# Patient Record
Sex: Male | Born: 1990 | Race: White | Hispanic: No | Marital: Single | Smoking: Former smoker
Health system: Southern US, Community
[De-identification: ages and names within clinical notes are randomized; demographics above are authoritative.]

## PROBLEM LIST (undated history)

## (undated) DIAGNOSIS — F909 Attention-deficit hyperactivity disorder, unspecified type: Secondary | ICD-10-CM

## (undated) HISTORY — PX: FEMUR SURGERY: SHX943

---
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
## 2013-05-02 ENCOUNTER — Emergency Department: Payer: Self-pay | Admitting: Emergency Medicine

## 2013-05-02 LAB — CBC WITH DIFFERENTIAL/PLATELET
BASOS ABS: 0 10*3/uL (ref 0.0–0.1)
Basophil %: 0.2 %
EOS PCT: 0.7 %
Eosinophil #: 0.1 10*3/uL (ref 0.0–0.7)
HCT: 42.5 % (ref 40.0–52.0)
HGB: 14.9 g/dL (ref 13.0–18.0)
LYMPHS ABS: 2.3 10*3/uL (ref 1.0–3.6)
Lymphocyte %: 15.9 %
MCH: 29.7 pg (ref 26.0–34.0)
MCHC: 35.1 g/dL (ref 32.0–36.0)
MCV: 85 fL (ref 80–100)
MONO ABS: 1.5 x10 3/mm — AB (ref 0.2–1.0)
Monocyte %: 10.3 %
NEUTROS ABS: 10.7 10*3/uL — AB (ref 1.4–6.5)
Neutrophil %: 72.9 %
PLATELETS: 225 10*3/uL (ref 150–440)
RBC: 5.04 10*6/uL (ref 4.40–5.90)
RDW: 12.7 % (ref 11.5–14.5)
WBC: 14.6 10*3/uL — AB (ref 3.8–10.6)

## 2013-05-02 LAB — RAPID INFLUENZA A&B ANTIGENS (ARMC ONLY)

## 2013-05-02 LAB — COMPREHENSIVE METABOLIC PANEL
ALBUMIN: 4.4 g/dL (ref 3.4–5.0)
ALT: 15 U/L (ref 12–78)
AST: 12 U/L — AB (ref 15–37)
Alkaline Phosphatase: 63 U/L
Anion Gap: 7 (ref 7–16)
BUN: 12 mg/dL (ref 7–18)
Bilirubin,Total: 1 mg/dL (ref 0.2–1.0)
CREATININE: 0.71 mg/dL (ref 0.60–1.30)
Calcium, Total: 9.6 mg/dL (ref 8.5–10.1)
Chloride: 101 mmol/L (ref 98–107)
Co2: 25 mmol/L (ref 21–32)
EGFR (African American): >60
EGFR (Non-African Amer.): >60
Glucose: 80 mg/dL (ref 65–99)
OSMOLALITY: 265 (ref 275–301)
Potassium: 3.5 mmol/L (ref 3.5–5.1)
Sodium: 133 mmol/L — ABNORMAL LOW (ref 136–145)
Total Protein: 8 g/dL (ref 6.4–8.2)

## 2013-05-02 LAB — URINALYSIS, COMPLETE
BLOOD: NEGATIVE
Bacteria: NONE SEEN
Bilirubin,UR: NEGATIVE
Glucose,UR: NEGATIVE mg/dL (ref 0–75)
LEUKOCYTE ESTERASE: NEGATIVE
Nitrite: NEGATIVE
PROTEIN: NEGATIVE
Ph: 5 (ref 4.5–8.0)
SQUAMOUS EPITHELIAL: NONE SEEN
Specific Gravity: 1.027 (ref 1.003–1.030)

## 2013-05-02 LAB — LIPASE, BLOOD: Lipase: 105 U/L (ref 73–393)

## 2013-05-03 LAB — MONONUCLEOSIS SCREEN: MONO TEST: NEGATIVE

## 2014-02-04 ENCOUNTER — Ambulatory Visit: Payer: Self-pay | Admitting: Family Medicine

## 2014-08-16 ENCOUNTER — Encounter: Payer: Self-pay | Admitting: Family Medicine

## 2014-08-16 DIAGNOSIS — Z8379 Family history of other diseases of the digestive system: Secondary | ICD-10-CM | POA: Insufficient documentation

## 2015-05-25 ENCOUNTER — Ambulatory Visit
Admission: EM | Admit: 2015-05-25 | Discharge: 2015-05-25 | Disposition: A | Discharge status: Home / Self Care | Payer: BLUE CROSS/BLUE SHIELD | Attending: Family Medicine | Admitting: Family Medicine

## 2015-05-25 ENCOUNTER — Encounter: Payer: Self-pay | Admitting: Emergency Medicine

## 2015-05-25 DIAGNOSIS — H6501 Acute serous otitis media, right ear: Secondary | ICD-10-CM

## 2015-05-25 DIAGNOSIS — J018 Other acute sinusitis: Secondary | ICD-10-CM | POA: Diagnosis not present

## 2015-05-25 LAB — RAPID STREP SCREEN (MED CTR MEBANE ONLY): Streptococcus, Group A Screen (Direct): NEGATIVE

## 2015-05-25 MED ORDER — AMOXICILLIN-POT CLAVULANATE 875-125 MG PO TABS: 1.0000 | ORAL_TABLET | Freq: Two times a day (BID) | ORAL | Status: DC

## 2015-05-25 NOTE — ED Provider Notes (Signed)
CSN: [id number]     Arrival date & time 05/25/15  1021 History   First MD Initiated Contact with Patient 05/25/15 1127     Chief Complaint  Patient presents with  . URI   HPI   Danny Miles is a pleasant 25 y.o. male who presents with one-month history of sore throat, sinus pressure, thus rhinorrhea, fever. He states he cannot get rid of head congestion and stuffiness. His mother has had a recent URI and otitis. His MAXIMUM TEMPERATURE has been 101. He has a productive cough with green phlegm. 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 She has tried Mucinex D, Flonase, and Aleve with some relief. Symptoms are worse when he is up and working better with rest. Discomfort 5/10 currently.  He is accompanied by his mother.   History reviewed. No pertinent past medical history. Past Surgical History  Procedure Laterality Date  . Femur surgery     History reviewed. No pertinent family history. Social History  Substance Use Topics  . Smoking status: Current Every Day Smoker    Types: Cigarettes  . Smokeless tobacco: Never Used  . Alcohol Use: 0.6 oz/week    1 Shots of liquor per week    Review of Systems  Constitutional: Positive for fever, chills, activity change, appetite change and fatigue. Negative for diaphoresis and unexpected weight change.  HENT: Positive for congestion, ear pain, postnasal drip, rhinorrhea, sinus pressure, sneezing and sore throat. Negative for trouble swallowing.   Eyes: Positive for redness. Negative for photophobia, pain, discharge and itching.  Respiratory: Positive for cough. Negative for chest tightness, shortness of breath, wheezing and stridor.   Cardiovascular: Negative.   Gastrointestinal: Negative.   Genitourinary: Negative.   Musculoskeletal: Negative.   Skin: Negative.   Neurological: Negative.   Psychiatric/Behavioral: Negative.     Allergies  Review of patient's allergies indicates no known allergies.  Home Medications    Prior to Admission medications   Not on File   Meds Ordered and Administered this Visit  Medications - No data to display  BP 111/60 mmHg  Pulse 87  Temp(Src) 97.7 F (36.5 C) (Tympanic)  Resp 18  Ht  (1.778 m)  Wt 176 lb (79.833 kg)  BMI 25.25 kg/m2  SpO2 98% No data found.   Physical Exam  Constitutional: He is oriented to person, place, and time. He appears well-developed and well-nourished. No distress.  HENT:  Head: Normocephalic and atraumatic.  Right Ear: There is tenderness. Tympanic membrane is injected and erythematous. A middle ear effusion is present.  Left Ear: Tympanic membrane, external ear and ear canal normal.  Nose: Mucosal edema, rhinorrhea and sinus tenderness present. Right sinus exhibits maxillary sinus tenderness and frontal sinus tenderness. Left sinus exhibits maxillary sinus tenderness and frontal sinus tenderness.  Mouth/Throat: Uvula is midline and mucous membranes are normal. Oropharyngeal exudate and posterior oropharyngeal erythema present. No posterior oropharyngeal edema or tonsillar abscesses.  Eyes: Conjunctivae are normal. No scleral icterus.  Neck: Normal range of motion.  Cardiovascular: Normal rate and regular rhythm.   Pulmonary/Chest: Effort normal and breath sounds normal. No respiratory distress.  Abdominal: Soft. Bowel sounds are normal. He exhibits no distension.  Musculoskeletal: Normal range of motion. He exhibits no edema or tenderness.  Neurological: He is alert and oriented to person, place, and time. No cranial nerve deficit.  Skin: Skin is warm and dry. No rash noted. No erythema.  Psychiatric: His behavior is normal. Judgment normal.  Nursing  note and vitals reviewed.   ED Course  Procedures N/A Labs Reviewed  RAPID STREP SCREEN (NOT AT [facility])  CULTURE, GROUP A STREP [facility])   MDM   1. Right acute serous otitis media, recurrence not specified   2. Other acute sinusitis    Plan: Test results and diagnosis  reviewed with patient Rx as per orders;  benefits, risks, potential side effects reviewed  Recommend supportive treatment with rest, increase fluids, continue Aleve, Flonase, and Mucinex as directed Seek additional medical care if symptoms worsen or are not improving in 7-10 days     Joselyn Arrow, NP 05/25/15 1145

## 2015-05-25 NOTE — Discharge Instructions (Signed)
° ° °  One of your biggest health concerns is your smoking which increases your risk for most cancers and serious cardiovascular diseases such as strokes & heart attacks.  You should try your best to stop.  If you need assistance, please contact your PCP or Smoking Cessation Class at [facility] [phone]) or [facility] Quit-Line (1-800-QUIT-NOW).

## 2015-05-25 NOTE — ED Notes (Signed)
Cough, congested, ear pain, shortness of breath, sore throat on and off for 1 month

## 2015-05-27 ENCOUNTER — Telehealth: Payer: Self-pay | Admitting: *Deleted

## 2015-05-27 NOTE — ED Notes (Signed)
Called patient and informed him that his strep culture came back negative. Patient reported that he is feeling better and will follow up with his PCP if symptoms return.

## 2015-05-28 LAB — CULTURE, GROUP A STREP (THRC)

## 2017-01-21 ENCOUNTER — Ambulatory Visit
Admission: EM | Admit: 2017-01-21 | Discharge: 2017-01-21 | Disposition: A | Discharge status: Home / Self Care | Payer: BLUE CROSS/BLUE SHIELD | Attending: Family Medicine | Admitting: Family Medicine

## 2017-01-21 ENCOUNTER — Encounter: Payer: Self-pay | Admitting: *Deleted

## 2017-01-21 DIAGNOSIS — Z202 Contact with and (suspected) exposure to infections with a predominantly sexual mode of transmission: Secondary | ICD-10-CM

## 2017-01-21 DIAGNOSIS — R599 Enlarged lymph nodes, unspecified: Secondary | ICD-10-CM | POA: Diagnosis not present

## 2017-01-21 DIAGNOSIS — S5012XA Contusion of left forearm, initial encounter: Secondary | ICD-10-CM | POA: Diagnosis not present

## 2017-01-21 DIAGNOSIS — Z113 Encounter for screening for infections with a predominantly sexual mode of transmission: Secondary | ICD-10-CM

## 2017-01-21 DIAGNOSIS — R300 Dysuria: Secondary | ICD-10-CM | POA: Diagnosis not present

## 2017-01-21 HISTORY — DX: Attention-deficit hyperactivity disorder, unspecified type: F90.9

## 2017-01-21 LAB — URINALYSIS, COMPLETE (UACMP) WITH MICROSCOPIC
Bacteria, UA: NONE SEEN
Bilirubin Urine: NEGATIVE
GLUCOSE, UA: NEGATIVE mg/dL
HGB URINE DIPSTICK: NEGATIVE
KETONES UR: NEGATIVE mg/dL
LEUKOCYTES UA: NEGATIVE
NITRITE: NEGATIVE
PH: 7 (ref 5.0–8.0)
Protein, ur: NEGATIVE mg/dL
RBC / HPF: NONE SEEN RBC/hpf (ref 0–5)
SPECIFIC GRAVITY, URINE: 1.02 (ref 1.005–1.030)
SQUAMOUS EPITHELIAL / LPF: NONE SEEN

## 2017-01-21 LAB — CHLAMYDIA/NGC RT PCR (ARMC ONLY)
CHLAMYDIA TR: NOT DETECTED
N GONORRHOEAE: NOT DETECTED

## 2017-01-21 MED ORDER — CEFTRIAXONE SODIUM 250 MG IJ SOLR
250.0000 mg | Freq: Once | INTRAMUSCULAR | Status: AC
  Administered 2017-01-21: 250 mg via INTRAMUSCULAR

## 2017-01-21 MED ORDER — DOXYCYCLINE HYCLATE 100 MG PO CAPS: 100.0000 mg | ORAL_CAPSULE | Freq: Two times a day (BID) | ORAL | 0 refills | Status: DC

## 2017-01-21 NOTE — Discharge Instructions (Signed)
Take medication as prescribed. Rest. Drink plenty of fluids. Monitor closely. No sexual activity until resolved.   Follow up with your primary care physician as discussed. Return to Urgent care for new or worsening concerns.

## 2017-01-21 NOTE — ED Provider Notes (Addendum)
[facility] ____________________________________________  Time seen: Approximately 1020 AM  I have reviewed the triage vital signs and the nursing notes.   HISTORY  Chief Complaint Dysuria and Mass   HPI Danny Miles is a 26 y.o. male presenting for evaluation of 2 swollen slightly tender areas in the groin that is been present for approximately 3 days. Patient also reports this morning when urinating he was having body present, as well as states slight burning the tip of penis currently. Denies any penile discharge or drainage.  Denies any penile or testicular pain, rash, swelling, abnormal appearance. Denies urinary frequency or urinary urgency. States one episode today felt like his urine flow was slightly decreased. Patient reports that he is sexually active with one partner, stating same partner for approximately 2 years. States unsure if his sexual partner has had any other sexual partners. Denies any history of similar in the past. Reports continues with normal bowel movements, last bowel movement yesterday and described as normal. Denies any abnormal colored stool or abnormal color urine. Denies recent sickness, cough, sore throat, fevers. States has never had a cold sore.  Patient also reports he has a bruise to his left forearm and felt like there was a knot inside the cruise, wanted that to be evaluated while he was here. 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 Denies any fall or blunt trauma, paresthesias or decreased range of motion. Denies chest pain, shortness of breath, abdominal pain, extremity pain, extremity swelling or rash. Denies recent sickness. Denies recent antibiotic use. Denies any past history of STDs, HIV history.  Gilles Chiquito,  MD: PCP   Past Medical History:  Diagnosis Date  . ADHD     Patient Active Problem List   Diagnosis Date Noted  . Family history of colonic diverticulitis 08/16/2014    Past Surgical History:  Procedure Laterality Date  . FEMUR SURGERY       No current facility-administered medications for this encounter.   Current Outpatient Prescriptions:  .  amphetamine-dextroamphetamine (ADDERALL) 10 MG tablet, Take 10 mg by mouth daily with breakfast., Disp: , Rfl:  .  amoxicillin-clavulanate (AUGMENTIN) 875-125 MG tablet, Take 1 tablet by mouth every 12 (twelve) hours., Disp: 14 tablet, Rfl: 0 .  doxycycline (VIBRAMYCIN) 100 MG capsule, Take 1 capsule (100 mg total) by mouth 2 (two) times daily., Disp: 28 capsule, Rfl: 0  Allergies Patient has no known allergies.   family history Grandmother- cancer  Social History Social History  Substance Use Topics  . Smoking status: Current Every Day Smoker    Types: Cigarettes  . Smokeless tobacco: Never Used  . Alcohol use 0.6 oz/week    1 Shots of liquor per week    Review of Systems Constitutional: No fever/chills. ENT: No sore throat. Cardiovascular: Denies chest pain. Respiratory: Denies shortness of breath. Gastrointestinal: No abdominal pain. No nausea, no vomiting.  No diarrhea.  No constipation. Genitourinary: Positive for dysuria. Musculoskeletal: Negative for back pain. Skin: Negative for rash.  ____________________________________________   PHYSICAL EXAM:  VITAL SIGNS: ED Triage Vitals  Enc Vitals Group     BP 01/21/17 0929 123/76     Pulse Rate 01/21/17 0929 69     Resp 01/21/17 0929 16  Temp 01/21/17 0929 98 F (36.7 C)     Temp Source 01/21/17 0929 Oral     SpO2 01/21/17 0929 100 %     Weight 01/21/17 0931 155 lb (70.3 kg)     Height 01/21/17 0931  (1.753 m)     Head Circumference --      Peak Flow --      Pain Score --      Pain Loc --      Pain Edu? --      Excl. in GC? --      Constitutional: Alert and oriented. Well appearing and in no acute distress. ENT      Head: Normocephalic and atraumatic.      Mouth/Throat: Mucous membranes are moist.Oropharynx non-erythematous.  Hematological/Lymphatic/Immunilogical: No cervical lymphadenopathy. Cardiovascular: Normal rate, regular rhythm. Grossly normal heart sounds.  Good peripheral circulation. Respiratory: Normal respiratory effort without tachypnea nor retractions. Breath sounds are clear and equal bilaterally. No wheezes, rales, rhonchi. Gastrointestinal: Soft and nontender. No CVA tenderness. Male: Patient declines chaperone.   No penile or testicular tenderness. Mild erythema present at the dorsal base of penile shaft, patient states this is new. No ulcerative lesions. No visible bulge. Bilateral inguinal mildly tender mobile lymph nodes palpable and enlarged, left greater than right and approximately 2-3 cm in size, non-erythematous, no other lymph node swelling palpated in this area. No hernia noted. No other skin changes noted.  Musculoskeletal:   No midline cervical, thoracic or lumbar tenderness to palpation. Left forearm area of ecchymosis appears to be resolving with slight centered tenderness to palpation, no swelling, no bony tenderness, full range of motion present, bilateral hand grips strong and equal, bilateral distal radial pulses equal. Neurologic:  Normal speech and language.  Speech is normal. No gait instability.  Skin:  Skin is warm, dry and intact.  Psychiatric: Mood and affect are normal. Speech and behavior are normal. Patient exhibits appropriate insight and judgment   ___________________________________________   LABS (all labs ordered are listed, but only abnormal results are displayed)  Labs Reviewed  URINALYSIS, COMPLETE (UACMP) WITH MICROSCOPIC - Abnormal; Notable for the following:       Result Value   APPearance HAZY (*)    All other components within normal limits   CHLAMYDIA/NGC RT PCR ([facility] ONLY)  URINE CULTURE  MISC [facility] TEST (SEND OUT)  HIV ANTIBODY (ROUTINE TESTING)  RPR  HSV(HERPES SIMPLEX VRS) I + II AB-IGG  HSV(HERPES SIMPLEX VRS) I + II AB-IGM  HEPATITIS PANEL, ACUTE     PROCEDURES Procedures   INITIAL IMPRESSION / ASSESSMENT AND PLAN / ED COURSE  Pertinent labs & imaging results that were available during my care of the patient were reviewed by me and considered in my medical decision making (see chart for details).  Well-appearing patient. No acute distress. Patient's expressed concerns of STD. Patient requests testing of "all "STDs, and states requests gonorrhea and Chlamydia, trichomonas, HIV, RPR, herpes and hepatitis. Urinalysis reviewed, no UTI. Discussed in detail with patient concern of STD versus prostatitis versus skin infection. Patient declined rectal exam. 250 mg IM Rocephin given once in urgent care. Will treat patient with oral doxycycline. Discussed no sexual activity and told on results received and reevaluated. Discussed indication, risks and benefits of medications with patient. Discussed strict follow-up and return parameters. Encouraged patient to closely monitor lymph node swelling, not forcefully rubbed the area, and seek reevaluation if symptoms persist.   Discussed follow up with Primary care physician this week.  Discussed follow up and return parameters including no resolution or any worsening concerns. Patient verbalized understanding and agreed to plan.   ____________________________________________   FINAL CLINICAL IMPRESSION(S) / ED DIAGNOSES  Final diagnoses:  Dysuria  Screen for STD (sexually transmitted disease)  Swelling of lymph nodes  Contusion of left forearm, initial encounter     Discharge Medication List as of 01/21/2017 11:10 AM    START taking these medications   Details  doxycycline (VIBRAMYCIN) 100 MG capsule Take 1 capsule (100 mg total) by mouth 2 (two) times daily.,  Starting Thu 01/21/2017, Normal        Note: This dictation was prepared with Dragon dictation along with smaller phrase technology. Any transcriptional errors that result from this process are unintentional.         Renford Dills, NP 01/21/17 1217

## 2017-01-21 NOTE — ED Triage Notes (Signed)
Dysuria and 2 "lymph nodes" enlarged and tender to touch x3 days. Also, a "knot" and bruise to left forearm.

## 2017-01-22 LAB — HSV(HERPES SIMPLEX VRS) I + II AB-IGG

## 2017-01-22 LAB — RPR: RPR: NONREACTIVE

## 2017-01-22 LAB — HEPATITIS PANEL, ACUTE
HCV Ab: <0.1 s/co ratio (ref 0.0–0.9)
Hep A IgM: NEGATIVE
Hep B C IgM: NEGATIVE
Hepatitis B Surface Ag: NEGATIVE

## 2017-01-22 LAB — HIV ANTIBODY (ROUTINE TESTING W REFLEX): HIV SCREEN 4TH GENERATION: NONREACTIVE

## 2017-01-23 LAB — URINE CULTURE
Culture: NO GROWTH
SPECIAL REQUESTS: NORMAL

## 2017-01-23 LAB — MISC LABCORP TEST (SEND OUT): LABCORP TEST CODE: 188052

## 2017-01-25 LAB — HSV(HERPES SIMPLEX VRS) I + II AB-IGM: HSVI/II Comb IgM: 1.02 Ratio — ABNORMAL HIGH (ref 0.00–0.90)

## 2017-04-07 ENCOUNTER — Encounter: Payer: Self-pay | Admitting: *Deleted

## 2017-04-07 ENCOUNTER — Other Ambulatory Visit: Payer: Self-pay

## 2017-04-07 ENCOUNTER — Ambulatory Visit (INDEPENDENT_AMBULATORY_CARE_PROVIDER_SITE_OTHER): Payer: BLUE CROSS/BLUE SHIELD

## 2017-04-07 ENCOUNTER — Ambulatory Visit
Admission: EM | Admit: 2017-04-07 | Discharge: 2017-04-07 | Disposition: A | Discharge status: Home / Self Care | Payer: BLUE CROSS/BLUE SHIELD | Attending: Family Medicine | Admitting: Family Medicine

## 2017-04-07 DIAGNOSIS — S61212A Laceration without foreign body of right middle finger without damage to nail, initial encounter: Secondary | ICD-10-CM | POA: Diagnosis not present

## 2017-04-07 DIAGNOSIS — S61202A Unspecified open wound of right middle finger without damage to nail, initial encounter: Secondary | ICD-10-CM | POA: Diagnosis not present

## 2017-04-07 DIAGNOSIS — W312XXA Contact with powered woodworking and forming machines, initial encounter: Secondary | ICD-10-CM

## 2017-04-07 DIAGNOSIS — S61209A Unspecified open wound of unspecified finger without damage to nail, initial encounter: Secondary | ICD-10-CM

## 2017-04-07 MED ORDER — MUPIROCIN 2 % EX OINT: TOPICAL_OINTMENT | CUTANEOUS | 0 refills | Status: DC

## 2017-04-07 MED ORDER — LIDOCAINE HCL (PF) 1 % IJ SOLN: 5.0000 mL | Freq: Once | INTRAMUSCULAR | Status: DC

## 2017-04-07 MED ORDER — CEPHALEXIN 500 MG PO CAPS: 500.0000 mg | ORAL_CAPSULE | Freq: Four times a day (QID) | ORAL | 0 refills | Status: AC

## 2017-04-07 NOTE — Discharge Instructions (Addendum)
Take medication as prescribed. Rest area. Drink plenty of fluids. Keep clean.   Follow up with your primary care physician this week as discussed for wound check. Return to Urgent care for new or worsening concerns.

## 2017-04-07 NOTE — ED Provider Notes (Signed)
[facility] ____________________________________________  Time seen: Approximately 7:44 PM  I have reviewed the triage vital signs and the nursing notes.   HISTORY  Chief Complaint Laceration   HPI Danny Miles is a 26 y.o. male presenting for evaluation of right third distal finger laceration that occurred at 10 AM this morning while he was at work.  Denies workers comp claim.  Patient states that he was pushing through a piece of wood through a circular saw and his finger got caught into the saw causing the injury.  Patient states last tetanus immunization was about 3 years ago, states is definitely under 5 years.  States mild pain to the tip of the finger, but otherwise finger feels fine.  States that he continue to work today as he had a deadline that he had to meet.  States he did clean it and bandage it.  No other alleviating measures attempted prior to arrival.  Reports otherwise feels well.  Denies fall, other injury or head injury. Denies chest pain, shortness of breath, abdominal pain.  Denies recent sickness. Denies recent antibiotic use.   Gilles Chiquito, MD:PCP   Past Medical History:  Diagnosis Date  . ADHD     Patient Active Problem List   Diagnosis Date Noted  . Family history of colonic diverticulitis 08/16/2014    Past Surgical History:  Procedure Laterality Date  . FEMUR SURGERY        Current Facility-Administered Medications:  .  lidocaine (PF) (XYLOCAINE) 1 % injection 5 mL, 5 mL, Other, Once, Renford Dills, NP  Current Outpatient Medications:  .  amphetamine-dextroamphetamine (ADDERALL) 10 MG tablet, Take 10 mg by mouth daily with breakfast., Disp: , Rfl:  .  Fluoxetine HCl, PMDD, 20 MG CAPS, Take by mouth daily., Disp: , Rfl:  .  cephALEXin (KEFLEX) 500 MG capsule, Take 1 capsule (500 mg total) by mouth 4 (four) times daily for 7 days., Disp: 28 capsule, Rfl: 0 .  mupirocin ointment (BACTROBAN) 2 %, Apply two times a day  for 10 days., Disp: 22 g, Rfl: 0  Allergies Patient has no known allergies.  History reviewed. No pertinent family history.  Social History Social History   Tobacco Use  . Smoking status: Current Every Day Smoker    Types: Cigarettes  . Smokeless tobacco: Never Used  Substance Use Topics  . Alcohol use: Yes    Alcohol/week: 0.6 oz    Types: 1 Shots of liquor per week  . Drug use: No    Review of Systems Constitutional: No fever/chills. Cardiovascular: Denies chest pain. Respiratory: Denies shortness of breath. Gastrointestinal: No abdominal pain.   Musculoskeletal: Negative for back pain. Skin: As above.  ____________________________________________   PHYSICAL EXAM:  VITAL SIGNS: ED Triage Vitals  Enc Vitals Group     BP 04/07/17 1913 132/90     Pulse Rate 04/07/17 1913 93     Resp 04/07/17 1913 18     Temp 04/07/17 1913 98.4 F (36.9 C)     Temp Source 04/07/17 1913 Oral     SpO2 04/07/17 1913 100 %     Weight 04/07/17 1915 150 lb (68 kg)     Height 04/07/17 1915 5\' 8"  (1.727 m)     Head Circumference --      Peak Flow --      Pain Score 04/07/17 1915 10     Pain Loc --      Pain Edu? --      Excl.  in GC? --     Constitutional: Alert and oriented. Well appearing and in no acute distress. Cardiovascular: Normal rate, regular rhythm. Grossly normal heart sounds.  Good peripheral circulation. Respiratory: Normal respiratory effort without tachypnea nor retractions. Breath sounds are clear and equal bilaterally. No wheezes, rales, rhonchi. Musculoskeletal:  Steady gait.  Neurologic:  Normal speech and language. Speech is normal. No gait instability.  Skin:  Skin is warm, dry. Except: Right third digit distal palmar phalanx fingertip 1.5 cm distal skin avulsion with less than 1 cm deeper avulsion with subcutaneous tissue exposed, no bone visualized, mild active bleeding, mild to moderate tenderness to palpation, no nail involvement, no foreign body found, distal  sensation and capillary refill intact, no motor or tendon deficit noted, right hand otherwise nontender and with full strength. Psychiatric: Mood and affect are normal. Speech and behavior are normal. Patient exhibits appropriate insight and judgment   ___________________________________________   LABS (all labs ordered are listed, but only abnormal results are displayed)  Labs Reviewed - No data to display ____________________________________________  RADIOLOGY  Dg Finger Middle Right  Result Date: 04/07/2017 CLINICAL DATA:  Patient cut the end of his right middle finger today at work. Patient states that this is not a workers Education officer, environmentalcomp claim. CT EXAM: RIGHT MIDDLE FINGER 2+V COMPARISON:  None. FINDINGS: Soft tissue defect at the tip of the third digit. No fracture of the distal phalanx. No foreign body. IMPRESSION: Soft tissue injury without evidence of fracture or foreign body. Electronically Signed   By: Genevive BiStewart  Edmunds M.D.   On: 04/07/2017 20:07   ____________________________________________   PROCEDURES Procedures   Procedure(s) performed:  Procedure explained and verbal consent obtained. Consent: Verbal consent obtained. Written consent not obtained. Risks and benefits: risks, benefits and alternatives were discussed Patient identity confirmed: verbally with patient and hospital-assigned identification number  Consent given by: patient   Laceration Repair Location: right third finger laceration and avulsion Length: 1.5 cm Foreign bodies: no foreign bodies Tendon involvement: none Nerve involvement: none Preparation: Patient was prepped and draped in the usual sterile fashion. Anesthesia with 1% Lidocaine 3 mls Irrigation solution: saline and betadine Irrigation method: jet lavage Amount of cleaning: copious no repair indicated Surgicel applied. Patient tolerate well.   Antibiotic ointment and dressing applied.  Wound care instructions provided.  Observe for any signs  of infection or other problems.      INITIAL IMPRESSION / ASSESSMENT AND PLAN / ED COURSE  Pertinent labs & imaging results that were available during my care of the patient were reviewed by me and considered in my medical decision making (see chart for details).  Well-appearing patient.  No acute distress.  Distal right third fingertip avulsion, majority of the wound superficial with less than 1 cm deeper avulsion.  Third digit x-ray soft tissue injury without evidence of fracture or foreign body.  Wound was copiously cleaned and irrigated with anesthesia as well as wound margins revised and debrided.  Surgicel utilized and directed in wound care.  Finger splint applied as well as topical antibiotic.  As antibiotic was from dirty saw as well as wound happened at 10 AM this morning, will place patient on oral Keflex as well as topical Bactroban.  Discussed and encouraged 3-4-day wound follow-up and sooner return follow-up as needed. Discussed indication, risks and benefits of medications with patient.  Discussed follow up with Primary care physician this week. Discussed follow up and return parameters including no resolution or any worsening concerns. Patient verbalized understanding  and agreed to plan.   ____________________________________________   FINAL CLINICAL IMPRESSION(S) / ED DIAGNOSES  Final diagnoses:  Laceration of right middle finger without foreign body without damage to nail, initial encounter  Avulsion of fingertip, initial encounter     ED Discharge Orders        Ordered    cephALEXin (KEFLEX) 500 MG capsule  4 times daily     04/07/17 2017    mupirocin ointment (BACTROBAN) 2 %     04/07/17 2017       Note: This dictation was prepared with Dragon dictation along with smaller phrase technology. Any transcriptional errors that result from this process are unintentional.         Renford DillsMiller, Philo Kurtz, NP 04/07/17 2142

## 2017-04-07 NOTE — ED Triage Notes (Signed)
Patient cut the end of his right middle finger today at work. Patient states that this is not a workers Education officer, environmentalcomp claim.

## 2019-01-15 ENCOUNTER — Ambulatory Visit
Admission: EM | Admit: 2019-01-15 | Discharge: 2019-01-15 | Disposition: A | Discharge status: Home / Self Care | Payer: BC Managed Care – PPO | Attending: Urgent Care | Admitting: Urgent Care

## 2019-01-15 ENCOUNTER — Other Ambulatory Visit: Payer: Self-pay

## 2019-01-15 DIAGNOSIS — Z20822 Contact with and (suspected) exposure to covid-19: Secondary | ICD-10-CM

## 2019-01-15 DIAGNOSIS — Z20828 Contact with and (suspected) exposure to other viral communicable diseases: Secondary | ICD-10-CM | POA: Insufficient documentation

## 2019-01-15 NOTE — ED Provider Notes (Signed)
[place], [place]   Name: Danny BalzarineCaleb Shane Miles DOB: February 01, 1991 MRN: [id number] CSN: [id number] PCP: Gilles Chiquitoabinowitz, Joseph H, MD  Arrival date and time:  01/15/19 1211  Chief Complaint:  covid exposure   NOTE: Prior to seeing the patient today, I have reviewed the triage nursing documentation and vital signs. Clinical staff has updated patient's PMH/PSHx, current medication list, and drug allergies/intolerances to ensure comprehensive history available to assist in medical decision making.   History:   HPI: Danny Miles is a 28 y.o. male who presents today with complaints of recent direct exposure to SARS-CoV-2 (novel coronavirus). Patient advising that his step brother was around someone last week who tested positive for SARS-CoV-2 (novel coronavirus). Out of precaution, the patient's step brother went and had himself tested for the virus because he was feeling more fatigued. Patient advised that step brother has officially tested positive for SARS-CoV-2. He is concerned about being infected because he lives in the same home. Patient reports last exposure with step brother was 2-3 days ago when they shared a pizza together at home. Mother is being tested here today and his father is being tested tomorrow. Patient has a 367 year old daughter that he is concerned about as well, however wants to get his results before subjecting her to the testing. Patient presents today with no symptoms; no cough, fevers, or other symptoms commonly associated with SARS-CoV-2. He advises that he feels generally well. Patient presents for testing out of concern for his personal health. He adds that he is is being required to provide documentation of negative test results before he will be allowed to return to work.   Past Medical History:  Diagnosis Date  . ADHD     Past Surgical History:  Procedure Laterality Date  . FEMUR SURGERY      No family history on file.  Social History   Tobacco Use  . Smoking  status: Current Every Day Smoker    Types: Cigarettes  . Smokeless tobacco: Never Used  Substance Use Topics  . Alcohol use: Yes    Alcohol/week: 1.0 standard drinks    Types: 1 Shots of liquor per week  . Drug use: No    Patient Active Problem List   Diagnosis Date Noted  . Family history of colonic diverticulitis 08/16/2014    Home Medications:    Current Meds  Medication Sig  . amphetamine-dextroamphetamine (ADDERALL) 10 MG tablet Take 10 mg by mouth daily with breakfast.    Allergies:   Patient has no known allergies.  Review of Systems (ROS): Review of Systems  Constitutional: Negative for fatigue and fever.  HENT: Negative for congestion, ear pain, postnasal drip, rhinorrhea, sinus pressure, sinus pain, sneezing and sore throat.   Eyes: Negative for pain, discharge and redness.  Respiratory: Negative for cough, chest tightness and shortness of breath.   Cardiovascular: Negative for chest pain and palpitations.  Gastrointestinal: Negative for abdominal pain, diarrhea, nausea and vomiting.  Musculoskeletal: Negative for arthralgias, back pain, myalgias and neck pain.  Skin: Negative for color change, pallor and rash.  Neurological: Negative for dizziness, syncope, weakness and headaches.  Hematological: Negative for adenopathy.     Vital Signs: Today's Vitals   01/15/19 1229 01/15/19 1230 01/15/19 1251  BP:  105/70   Pulse:  65   Resp:  16   Temp:  98.1 F (36.7 C)   TempSrc:  Oral   SpO2:  100%   Weight: 155 lb (70.3 kg)    Height:  5\' 9"  (1.753 m)    PainSc:   0-No pain    Physical Exam: Physical Exam  Constitutional: He is oriented to person, place, and time and well-developed, well-nourished, and in no distress. No distress.  HENT:  Head: Normocephalic and atraumatic.  Nose: Nose normal.  Mouth/Throat: Oropharynx is clear and moist.  Eyes: Pupils are equal, round, and reactive to light. Conjunctivae and EOM are normal.  Neck: Normal range of  motion. Neck supple. No tracheal deviation present.  Cardiovascular: Normal rate, regular rhythm, normal heart sounds and intact distal pulses. Exam reveals no gallop and no friction rub.  No murmur heard. Pulmonary/Chest: Effort normal and breath sounds normal. No respiratory distress. He has no wheezes. He has no rales.  Abdominal: Soft. Bowel sounds are normal. He exhibits no distension. There is no abdominal tenderness.  Musculoskeletal: Normal range of motion.  Lymphadenopathy:    He has no cervical adenopathy.  Neurological: He is alert and oriented to person, place, and time. Gait normal.  Skin: Skin is warm and dry. No rash noted. He is not diaphoretic.  Psychiatric: Mood, memory, affect and judgment normal.  Nursing note and vitals reviewed.   Urgent Care Treatments / Results:   LABS: PLEASE NOTE: all labs that were ordered this encounter are listed, however only abnormal results are displayed. Labs Reviewed  NOVEL CORONAVIRUS, NAA (HOSP ORDER, SEND-OUT TO REF LAB; TAT 18-24 HRS)    EKG: -None  RADIOLOGY: No results found.  PROCEDURES: Procedures  MEDICATIONS RECEIVED THIS VISIT: Medications - No data to display  PERTINENT CLINICAL COURSE NOTES/UPDATES:   Initial Impression / Assessment and Plan / Urgent Care Course:  Pertinent labs & imaging results that were available during my care of the patient were personally reviewed by me and considered in my medical decision making (see lab/imaging section of note for values and interpretations).  Danny Miles is a 28 y.o. male who presents to [facility] today with complaints of covid exposure  Patient overall well appearing and in no acute distress today in clinic. Presenting symptoms (see HPI) and exam as documented above. He presents following a direct exposure to SARS-CoV-2 (novel coronavirus). Discussed typical symptom constellation. Reviewed potential for infection with recent close contact. Given  exposure and potential for infection, testing is reasonable. SARS-CoV-2 swab collected by certified clinical staff. Discussed variable turn around times associated with testing, as swabs are being processed at [facility], and have been between 2-5 days to come back. He was advised to self quarantine, per [facility] DHHS guidelines, until negative results received.   Current clinical condition warrants patient being out of work in order to quarantine while waiting for testing results. He was provided with the appropriate documentation to provide to his place of employment that will allow for him to RTW on 01/17/2019 with no restrictions. RTW is contingent on his SARS-CoV-2 test results being reviewed as negative.    Discussed follow up with primary care physician should he develop any concerning symptoms. I have reviewed the follow up and strict return precautions for any new or worsening symptoms. Patient is aware of symptoms that would be deemed urgent/emergent, and would thus require further evaluation either here or in the emergency department. At the time of discharge, he verbalized understanding and consent with the discharge plan as it was reviewed with him. All questions were fielded by provider and/or clinic staff prior to patient discharge.   Final Clinical Impressions / Urgent Care Diagnoses:   Final diagnoses:  Close Exposure to Covid-19 Virus    New Prescriptions:  [place] Controlled Substance Registry consulted? Not Applicable  No orders of the defined types were placed in this encounter.   Recommended Follow up Care:  Patient encouraged to follow up with the following provider within the specified time frame, or sooner as dictated by the severity of his symptoms. As always, he was instructed that for any urgent/emergent care needs, he should seek care either here or in the emergency department for more immediate evaluation.  Follow-up Information    Karen Kitchens, MD.   Specialty: Family  Medicine Why: As needed Contact information: [address] [place] [address] [phone]         NOTE: This note was prepared using Dragon dictation software along with smaller phrase technology. Despite my best ability to proofread, there is the potential that transcriptional errors may still occur from this process, and are completely unintentional.    Karen Kitchens, NP 01/15/19 1306

## 2019-01-15 NOTE — ED Triage Notes (Signed)
wants to get tested since was around household member who was positive.  

## 2019-01-15 NOTE — Discharge Instructions (Signed)
It was very nice seeing you today in clinic. Thank you for entrusting me with your care.  ° °You have been tested for SARS-CoV-2 (novel coronavirus) today. Testing results have been taking between 2 and 5 days. Please self quarantine, per [place] DHHS guidelines, until you have received negative test results.  ° °If you develop any symptoms or concerns, make arrangements to follow up with your regular doctor. If your symptoms are severe, please seek follow up care in the ER. Please remember, our [facility] providers are "right here with you" when you need us.  ° °Again, it was my pleasure to take care of you today. Thank you for choosing our clinic. I hope that you start to feel better quickly.  ° °Lance Galas, MSN, APRN, FNP-C, CEN °Advanced Practice Provider °[facility] [place] [facility] °

## 2019-01-17 LAB — NOVEL CORONAVIRUS, NAA (HOSP ORDER, SEND-OUT TO REF LAB; TAT 18-24 HRS): SARS-CoV-2, NAA: NOT DETECTED

## 2019-01-18 ENCOUNTER — Encounter (HOSPITAL_COMMUNITY): Payer: Self-pay

## 2019-01-24 ENCOUNTER — Other Ambulatory Visit: Payer: Self-pay

## 2019-01-24 DIAGNOSIS — Z20822 Contact with and (suspected) exposure to covid-19: Secondary | ICD-10-CM

## 2019-01-25 LAB — NOVEL CORONAVIRUS, NAA: SARS-CoV-2, NAA: NOT DETECTED

## 2019-03-26 ENCOUNTER — Encounter: Payer: Self-pay | Admitting: Emergency Medicine

## 2019-03-26 ENCOUNTER — Other Ambulatory Visit: Payer: Self-pay

## 2019-03-26 ENCOUNTER — Ambulatory Visit
Admission: EM | Admit: 2019-03-26 | Discharge: 2019-03-26 | Disposition: A | Discharge status: Home / Self Care | Payer: BC Managed Care – PPO | Attending: Emergency Medicine | Admitting: Emergency Medicine

## 2019-03-26 DIAGNOSIS — J069 Acute upper respiratory infection, unspecified: Secondary | ICD-10-CM | POA: Diagnosis not present

## 2019-03-26 DIAGNOSIS — Z20828 Contact with and (suspected) exposure to other viral communicable diseases: Secondary | ICD-10-CM

## 2019-03-26 NOTE — ED Triage Notes (Signed)
Pt c/o cough, fever (100.9), body aches, SOB, change in taste, sweats, nasal congestion, runny nose, and diarrhea. Fever started yesterday but he had diarrhea for a week prior. He step dad and his mother both tested positive for covid within the last week

## 2019-03-26 NOTE — ED Provider Notes (Signed)
[facility]    CSN: [id number] Arrival date & time: 03/26/19  0830      History   Chief Complaint Chief Complaint  Patient presents with  . Cough    HPI Danny Miles is a 28 y.o. male presenting with onset of fatigue, low grade fevers, cough, nasal congestion, sore throat, body aches, and headaches yesterday.  He has been exposed to both of his parents who were recently diagnosed with COVID. They live in the same household. Patient denies any other symptoms. He denies chest pain, breathing difficulty, abdominal pain, n/v/d, or changes in smell and taste. Is otherwise healthy and denies cardiopulmonary disease. No other concerns today.  HPI  Past Medical History:  Diagnosis Date  . ADHD     Patient Active Problem List   Diagnosis Date Noted  . Family history of colonic diverticulitis 08/16/2014    Past Surgical History:  Procedure Laterality Date  . FEMUR SURGERY         Home Medications    Prior to Admission medications   Medication Sig Start Date End Date Taking? Authorizing Provider  amphetamine-dextroamphetamine (ADDERALL) 10 MG tablet Take 10 mg by mouth daily with breakfast.    [provider]  Fluoxetine HCl, PMDD, 20 MG CAPS Take by mouth daily.    [provider]  mupirocin ointment (BACTROBAN) 2 % Apply two times a day for 10 days. 04/07/17   Renford Dills, NP    Family History Family History  Problem Relation Age of Onset  . Healthy Mother   . Healthy Father     Social History Social History   Tobacco Use  . Smoking status: Former Smoker    Types: Cigarettes  . Smokeless tobacco: Never Used  Substance Use Topics  . Alcohol use: Yes    Alcohol/week: 1.0 standard drinks    Types: 1 Shots of liquor per week  . Drug use: No     Allergies   Patient has no known allergies.   Review of Systems Review of Systems  Constitutional: Positive for fatigue and fever.  HENT: Positive for congestion,  rhinorrhea and sore throat.   Respiratory: Positive for cough. Negative for shortness of breath.   Cardiovascular: Negative for chest pain.  Gastrointestinal: Negative for abdominal pain, diarrhea, nausea and vomiting.  Musculoskeletal: Positive for myalgias.  Neurological: Positive for headaches. Negative for weakness.     Physical Exam Triage Vital Signs ED Triage Vitals  Enc Vitals Group     BP 03/26/19 0850 (!) 121/108     Pulse Rate 03/26/19 0850 60     Resp 03/26/19 0850 18     Temp 03/26/19 0850 98.1 F (36.7 C)     Temp Source 03/26/19 0850 Oral     SpO2 03/26/19 0850 99 %     Weight 03/26/19 0846 160 lb (72.6 kg)     Height 03/26/19 0846 5\' 10"  (1.778 m)     Head Circumference --      Peak Flow --      Pain Score 03/26/19 0846 8     Pain Loc --      Pain Edu? --      Excl. in GC? --    No data found.  Updated Vital Signs BP (!) 121/108 (BP Location: Left Arm)   Pulse 60   Temp 98.1 F (36.7 C) (Oral)   Resp 18   Ht 5\' 10"  (1.778 m)   Wt 160 lb (72.6 kg)  SpO2 99%   BMI 22.96 kg/m    Physical Exam Vitals signs and nursing note reviewed.  Constitutional:      General: He is not in acute distress.    Appearance: Normal appearance. He is normal weight. He is not ill-appearing or toxic-appearing.  HENT:     Head: Normocephalic and atraumatic.     Nose: Rhinorrhea (trace clear drainage) present. No congestion.     Mouth/Throat:     Mouth: Mucous membranes are moist.     Pharynx: Oropharynx is clear. No posterior oropharyngeal erythema.  Eyes:     General: No scleral icterus.       Right eye: No discharge.        Left eye: No discharge.     Conjunctiva/sclera: Conjunctivae normal.  Neck:     Musculoskeletal: Normal range of motion and neck supple.  Cardiovascular:     Rate and Rhythm: Normal rate and regular rhythm.     Heart sounds: Normal heart sounds. No murmur.  Pulmonary:     Effort: Pulmonary effort is normal. No respiratory distress.      Breath sounds: Normal breath sounds.  Skin:    General: Skin is warm and dry.     Findings: No rash.  Neurological:     General: No focal deficit present.     Mental Status: He is alert. Mental status is at baseline.     Motor: No weakness.     Gait: Gait normal.  Psychiatric:        Mood and Affect: Mood normal.        Behavior: Behavior normal.        Thought Content: Thought content normal.      UC Treatments / Results  Labs (all labs ordered are listed, but only abnormal results are displayed) Labs Reviewed  NOVEL CORONAVIRUS, NAA (HOSP ORDER, SEND-OUT TO REF LAB; TAT 18-24 HRS)    EKG   Radiology No results found.  Procedures Procedures (including critical care time)  Medications Ordered in UC Medications - No data to display  Initial Impression / Assessment and Plan / UC Course  I have reviewed the triage vital signs and the nursing notes.  Pertinent labs & imaging results that were available during my care of the patient were reviewed by me and considered in my medical decision making (see chart for details).   Patient is a healthy 28 year old male with normal vital signs and benign exam. Exposed to at least 2 positive COVID cases in same household. Discussed with patient strong likelihood he will test positive given close contact and new onset COVID symptoms. Advised him to continue at home care and discussed ED precautions. 10 days of isolation but to ED if any severe acute worsening of symptoms.   Final Clinical Impressions(s) / UC Diagnoses   Final diagnoses:  Viral upper respiratory tract infection  Exposure to viral disease     Discharge Instructions     Your condition could be due to COVID 19 infection. We have obtained testing to determine this and results will come back in 3-7 days. In the mean time, assume this is going to be a positive test and treat/monitor yourself as if you do have COVID.   At this time go home and rest. Push fluids. Take  Tylenol as needed for discomfort. Gargle warm salt water. Throat lozenges. Take Mucinex DM or Robitussin for cough. Humidifier in bedroom to ease coughing. Warm showers. Also review the COVID handout  for more information.   COVID-19 INFECTION: The incubation period of COVID-19 is approximately 14 days after exposure, with most symptoms developing in roughly 4-5 days. Symptoms may range in severity from mild to critically severe. Roughly 80% of those infected will have mild symptoms. People of any age may become infected with COVID-19 and have the ability to transmit the virus. The most common symptoms include: fever, fatigue, cough, body aches, headaches, sore throat, nasal congestion, shortness of breath, nausea, vomiting, diarrhea, changes in smell and/or taste.     COURSE OF ILLNESS Some patients may begin with mild disease which can progress quickly into critical symptoms. If your symptoms are worsening please call ahead to the Emergency Department and proceed there for further treatment. Recovery time appears to be roughly 1-2 weeks for mild symptoms and 3-6 weeks for severe disease.    GO IMMEDIATELY TO ER FOR FEVER >100.6, BREATHING PROBLEMS, CHEST PAIN, FATIGUE, LETHARGY, INABILITY TO EAT OR DRINK, ETC   QUARANTINE AND ISOLATION: To help decrease the spread of COVID-19 please remain isolated if you have COVID infection or are highly suspected to have COVID infection. This means -stay home and isolate to one room in the home if you live with others. Do not share a bed or bathroom with others while ill, sanitize and wipe down all countertops and keep common areas clean and disinfected. You may discontinue isolation if you have a mild case and are asymptomatic 10 days after symptom onset as long as you have been fever free >72 hours without having to take Motrin or Tylenol. If your case is more severe, you may have to isolate longer.    If you have been in close contact (within 6 feet) of someone  diagnosed with COVID 19, you are advised to quarantine in your home for 14 days as symptoms can develop anywhere from 2-14 days after exposure to the virus. If you develop symptoms, you  must isolate.    During this global pandemic, CDC advises to practice social distancing, try to stay at least 506ft away from others at all times. Wear a face covering. Wash and sanitize your hands regularly and avoid going anywhere that is not necessary.   KEEP IN MIND THAT THE COVID TEST IS NOT 100% ACCURATE AND YOU SHOULD STILL DO EVERYTHING TO PREVENT POTENTIAL SPREAD OF VIRUS TO OTHERS (WEAR MASK, WEAR GLOVES, WASH HANDS AND SANITIZE REGULARLY)        ED Prescriptions    None     PDMP not reviewed this encounter.   Shirlee Latchaves, Lesley B, PA-C 03/26/19 1223

## 2019-03-26 NOTE — Discharge Instructions (Signed)
Your condition could be due to COVID 19 infection. We have obtained testing to determine this and results will come back in 3-7 days. In the mean time, assume this is going to be a positive test and treat/monitor yourself as if you do have COVID.   At this time go home and rest. Push fluids. Take Tylenol as needed for discomfort. Gargle warm salt water. Throat lozenges. Take Mucinex DM or Robitussin for cough. Humidifier in bedroom to ease coughing. Warm showers. Also review the COVID handout for more information.   COVID-19 INFECTION: The incubation period of COVID-19 is approximately 14 days after exposure, with most symptoms developing in roughly 4-5 days. Symptoms may range in severity from mild to critically severe. Roughly 80% of those infected will have mild symptoms. People of any age may become infected with COVID-19 and have the ability to transmit the virus. The most common symptoms include: fever, fatigue, cough, body aches, headaches, sore throat, nasal congestion, shortness of breath, nausea, vomiting, diarrhea, changes in smell and/or taste.     COURSE OF ILLNESS Some patients may begin with mild disease which can progress quickly into critical symptoms. If your symptoms are worsening please call ahead to the Emergency Department and proceed there for further treatment. Recovery time appears to be roughly 1-2 weeks for mild symptoms and 3-6 weeks for severe disease.    GO IMMEDIATELY TO ER FOR FEVER >100.6, BREATHING PROBLEMS, CHEST PAIN, FATIGUE, LETHARGY, INABILITY TO EAT OR DRINK, ETC   QUARANTINE AND ISOLATION: To help decrease the spread of COVID-19 please remain isolated if you have COVID infection or are highly suspected to have COVID infection. This means -stay home and isolate to one room in the home if you live with others. Do not share a bed or bathroom with others while ill, sanitize and wipe down all countertops and keep common areas clean and disinfected. You may discontinue  isolation if you have a mild case and are asymptomatic 10 days after symptom onset as long as you have been fever free >72 hours without having to take Motrin or Tylenol. If your case is more severe, you may have to isolate longer.    If you have been in close contact (within 6 feet) of someone diagnosed with COVID 19, you are advised to quarantine in your home for 14 days as symptoms can develop anywhere from 2-14 days after exposure to the virus. If you develop symptoms, you  must isolate.    During this global pandemic, CDC advises to practice social distancing, try to stay at least 74ft away from others at all times. Wear a face covering. Wash and sanitize your hands regularly and avoid going anywhere that is not necessary.   KEEP IN MIND THAT THE COVID TEST IS NOT 100% ACCURATE AND YOU SHOULD STILL DO EVERYTHING TO PREVENT POTENTIAL SPREAD OF VIRUS TO OTHERS (WEAR MASK, WEAR GLOVES, [place] HANDS AND SANITIZE REGULARLY)

## 2019-03-28 LAB — NOVEL CORONAVIRUS, NAA (HOSP ORDER, SEND-OUT TO REF LAB; TAT 18-24 HRS): SARS-CoV-2, NAA: DETECTED — AB

## 2019-03-29 ENCOUNTER — Telehealth: Payer: Self-pay | Admitting: Emergency Medicine

## 2019-03-29 NOTE — Telephone Encounter (Signed)

## 2019-04-12 ENCOUNTER — Other Ambulatory Visit: Payer: BC Managed Care – PPO

## 2019-06-19 IMAGING — CR DG FINGER MIDDLE 2+V*R*
3 series · 3 of 3 positions shown · non-contrast
Comparison: None.

CLINICAL DATA: Patient cut the end of his right middle finger today

EXAM:
RIGHT MIDDLE FINGER 2+V

[finger ap]
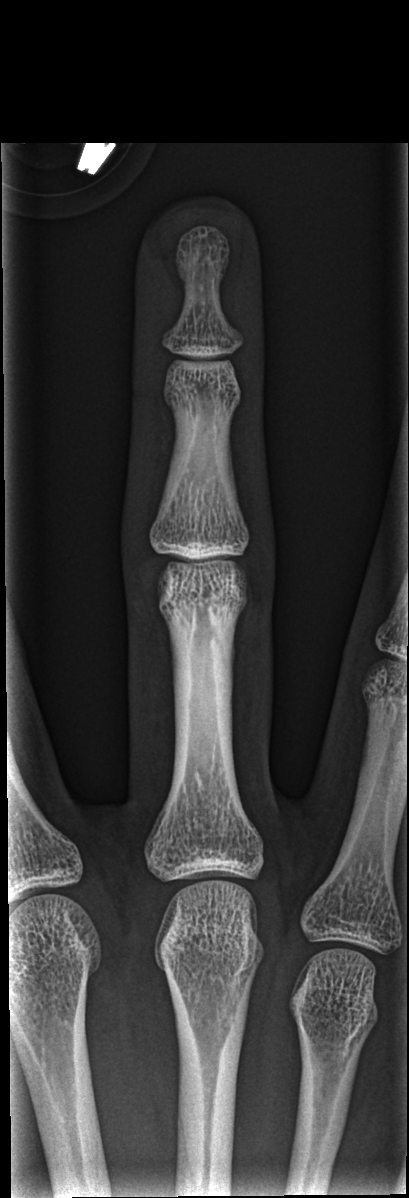

[finger obl]
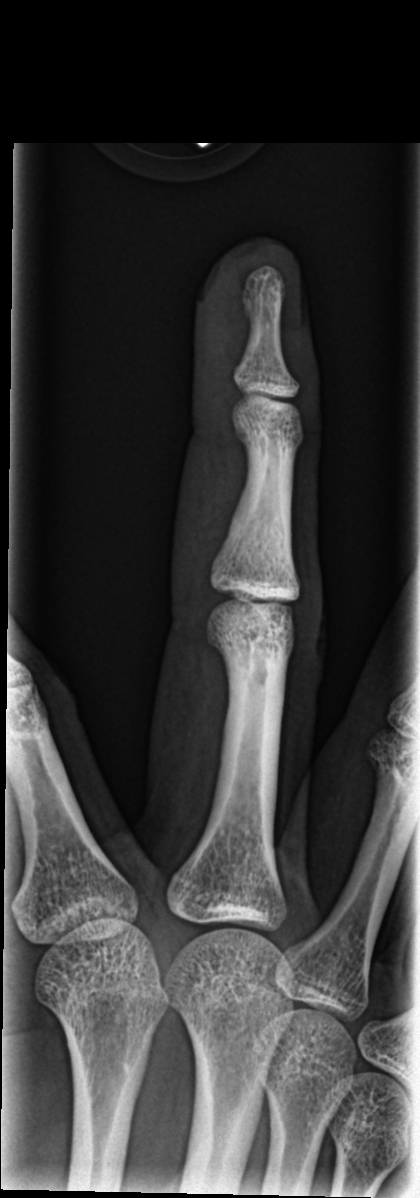

[finger lat]
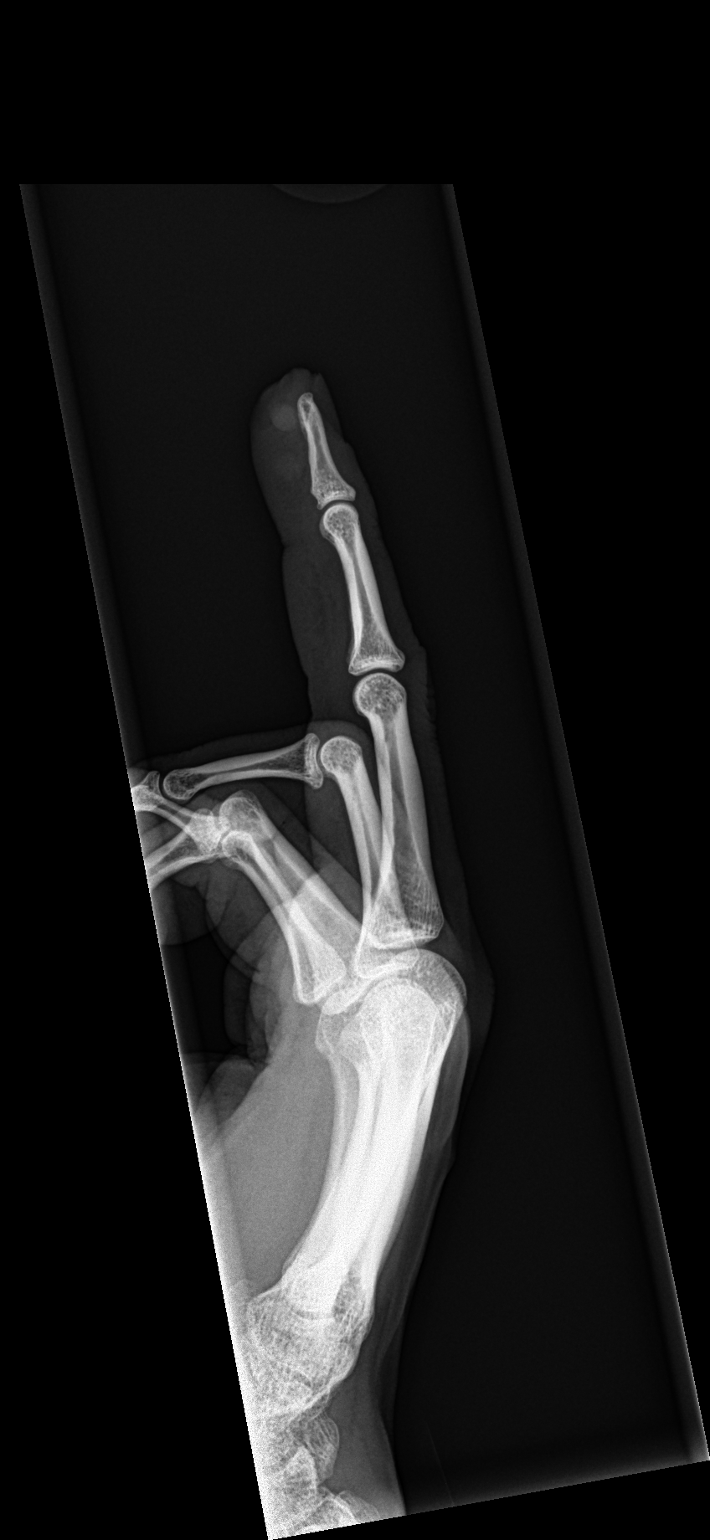

[3 of 3 positions shown; findings below may reference images not displayed]

FINDINGS: Soft tissue defect at the tip of the third digit. No fracture of the
distal phalanx. No foreign body.
IMPRESSION: Soft tissue injury without evidence of fracture or foreign body.

## 2021-03-24 ENCOUNTER — Telehealth: Payer: Medicaid Other | Admitting: Family

## 2021-03-24 DIAGNOSIS — Z20822 Contact with and (suspected) exposure to covid-19: Secondary | ICD-10-CM | POA: Diagnosis not present

## 2021-03-24 MED ORDER — ALBUTEROL SULFATE HFA 108 (90 BASE) MCG/ACT IN AERS: 2.0000 | INHALATION_SPRAY | Freq: Four times a day (QID) | RESPIRATORY_TRACT | 0 refills | Status: DC | PRN

## 2021-03-24 MED ORDER — MOLNUPIRAVIR EUA 200MG CAPSULE: 4.0000 | ORAL_CAPSULE | Freq: Two times a day (BID) | ORAL | 0 refills | Status: AC

## 2021-03-24 NOTE — Progress Notes (Signed)
Virtual Visit Consent   Danny Miles, you are scheduled for a virtual visit with a [facility] provider today.     Just as with appointments in the office, your consent must be obtained to participate.  Your consent will be active for this visit and any virtual visit you may have with one of our providers in the next 365 days.     If you have a MyChart account, a copy of this consent can be sent to you electronically.  All virtual visits are billed to your insurance company just like a traditional visit in the office.    As this is a virtual visit, video technology does not allow for your provider to perform a traditional examination.  This may limit your provider's ability to fully assess your condition.  If your provider identifies any concerns that need to be evaluated in person or the need to arrange testing (such as labs, EKG, etc.), we will make arrangements to do so.     Although advances in technology are sophisticated, we cannot ensure that it will always work on either your end or our end.  If the connection with a video visit is poor, the visit may have to be switched to a telephone visit.  With either a video or telephone visit, we are not always able to ensure that we have a secure connection.     I need to obtain your verbal consent now.   Are you willing to proceed with your visit today?    Kari Kerth has provided verbal consent on 03/24/2021 for a virtual visit (video or telephone).   Jannifer Rodney, FNP   Date: 03/24/2021 6:16 PM   Virtual Visit via Video Note   I, Jannifer Rodney, connected with  Danny Miles  ([id number], 12-31-1990) on 03/24/21 at  6:00 PM EST by a video-enabled telemedicine application and verified that I am speaking with the correct person using two identifiers.  Location: Patient: Virtual Visit Location Patient: Other: car Provider: Virtual Visit Location Provider: Home Office   I discussed the limitations of evaluation and  management by telemedicine and the availability of in person appointments. The patient expressed understanding and agreed to proceed.    History of Present Illness: Danny Miles is a 30 y.o. who identifies as a male who was assigned male at birth, and is being seen today for covid symptoms and exposure.  HPI: Cough This is a new problem. The current episode started in the past 7 days. The problem has been gradually worsening. The cough is Non-productive. Associated symptoms include chills, a fever, myalgias, nasal congestion, postnasal drip, a sore throat, shortness of breath and wheezing. Pertinent negatives include no ear congestion or ear pain. Risk factors for lung disease include smoking/tobacco exposure. He has tried OTC cough suppressant for the symptoms.   Problems:  Patient Active Problem List   Diagnosis Date Noted   Family history of colonic diverticulitis 08/16/2014    Allergies: No Known Allergies Medications:  Current Outpatient Medications:    albuterol (VENTOLIN HFA) 108 (90 Base) MCG/ACT inhaler, Inhale 2 puffs into the lungs every 6 (six) hours as needed for wheezing or shortness of breath., Disp: 8 g, Rfl: 0   molnupiravir EUA (LAGEVRIO) 200 mg CAPS capsule, Take 4 capsules (800 mg total) by mouth 2 (two) times daily for 5 days., Disp: 40 capsule, Rfl: 0  Observations/Objective: Patient is well-developed, well-nourished in no acute distress.  Resting comfortably  Head is normocephalic,  atraumatic.  No labored breathing.  Speech is clear and coherent with logical content.  Patient is alert and oriented at baseline.    Assessment and Plan: 1. Exposure to COVID-19 virus - molnupiravir EUA (LAGEVRIO) 200 mg CAPS capsule; Take 4 capsules (800 mg total) by mouth 2 (two) times daily for 5 days.  Dispense: 40 capsule; Refill: 0 - albuterol (VENTOLIN HFA) 108 (90 Base) MCG/ACT inhaler; Inhale 2 puffs into the lungs every 6 (six) hours as needed for wheezing or shortness  of breath.  Dispense: 8 g; Refill: 0  2. Encounter by telehealth for suspected COVID-19 - molnupiravir EUA (LAGEVRIO) 200 mg CAPS capsule; Take 4 capsules (800 mg total) by mouth 2 (two) times daily for 5 days.  Dispense: 40 capsule; Refill: 0 - albuterol (VENTOLIN HFA) 108 (90 Base) MCG/ACT inhaler; Inhale 2 puffs into the lungs every 6 (six) hours as needed for wheezing or shortness of breath.  Dispense: 8 g; Refill: 0 COVID positive, rest, force fluids, tylenol as needed, Quarantine for at least 5 days and you are fever free, then must wear a mask out in public from day 6-10, report any worsening symptoms such as increased shortness of breath, swelling, or continued high fevers. Possible adverse effects discussed with antivirals.    Follow Up Instructions: I discussed the assessment and treatment plan with the patient. The patient was provided an opportunity to ask questions and all were answered. The patient agreed with the plan and demonstrated an understanding of the instructions.  A copy of instructions were sent to the patient via MyChart unless otherwise noted below.     The patient was advised to call back or seek an in-person evaluation if the symptoms worsen or if the condition fails to improve as anticipated.  Time:  I spent 11 minutes with the patient via telehealth technology discussing the above problems/concerns.    Jannifer Rodney, FNP

## 2021-03-29 ENCOUNTER — Telehealth: Payer: Medicaid Other | Admitting: Nurse Practitioner

## 2021-03-29 DIAGNOSIS — F1193 Opioid use, unspecified with withdrawal: Secondary | ICD-10-CM | POA: Diagnosis not present

## 2021-03-29 NOTE — Progress Notes (Signed)
Virtual Visit Consent   [facility], you are scheduled for a virtual visit with Mary-Margaret Daphine Deutscher, FNP, a [facility] provider, today.     Just as with appointments in the office, your consent must be obtained to participate.  Your consent will be active for this visit and any virtual visit you may have with one of our providers in the next 365 days.     If you have a MyChart account, a copy of this consent can be sent to you electronically.  All virtual visits are billed to your insurance company just like a traditional visit in the office.    As this is a virtual visit, video technology does not allow for your provider to perform a traditional examination.  This may limit your provider's ability to fully assess your condition.  If your provider identifies any concerns that need to be evaluated in person or the need to arrange testing (such as labs, EKG, etc.), we will make arrangements to do so.     Although advances in technology are sophisticated, we cannot ensure that it will always work on either your end or our end.  If the connection with a video visit is poor, the visit may have to be switched to a telephone visit.  With either a video or telephone visit, we are not always able to ensure that we have a secure connection.     I need to obtain your verbal consent now.   Are you willing to proceed with your visit today? YES   Danny Miles has provided verbal consent on 03/29/2021 for a virtual visit (video or telephone).   Mary-Margaret Daphine Deutscher, FNP   Date: 03/29/2021 9:19 AM   Virtual Visit via Video Note   I, Mary-Margaret Daphine Deutscher, connected with Danny Miles ([id number], Sep 17, 1990) on 03/29/21 at  9:30 AM EST by a video-enabled telemedicine application and verified that I am speaking with the correct person using two identifiers.  Location: Patient: Virtual Visit Location Patient: Home Provider: Virtual Visit Location Provider: Mobile   I discussed the  limitations of evaluation and management by telemedicine and the availability of in person appointments. The patient expressed understanding and agreed to proceed.    History of Present Illness: Danny Miles is a 30 y.o. who identifies as a male who was assigned male at birth, and is being seen today for health question.  HPI: Patient is going through sever opiate withdrawals. He has had vomiting and diarrhea for the last 3 days. He has not been able to sleep or it. The last drug he did was 3 days ago which was powdered feantyl. 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 This is the first time he has had withdrawals   Review of Systems  Cardiovascular:  Positive for palpitations.  Neurological:  Positive for dizziness and headaches.  Psychiatric/Behavioral:  Positive for substance abuse. The patient is nervous/anxious.   All other systems reviewed and are negative.  Problems:  Patient Active Problem List   Diagnosis Date Noted   Family history of colonic diverticulitis 08/16/2014    Allergies: No Known Allergies Medications:  Current Outpatient Medications:    albuterol (VENTOLIN HFA) 108 (90 Base) MCG/ACT inhaler, Inhale 2 puffs into the lungs every 6 (six) hours as needed for wheezing or shortness of breath., Disp: 8 g, Rfl: 0   molnupiravir EUA (LAGEVRIO) 200 mg CAPS capsule, Take 4 capsules (800 mg total) by mouth 2 (two) times daily for  5 days., Disp: 40 capsule, Rfl: 0  Observations/Objective: Patient is well-developed, well-nourished in no acute distress.  Resting comfortably  at home.  Head is normocephalic, atraumatic.  No labored breathing.  Speech is clear and coherent with logical content.  Patient is alert and oriented at baseline.    Assessment and Plan:  [name] in today with chief complaint of Health Maintenance   1. Opioid withdrawal (HCC) Needs  to go to the ED.  Needs IV fluids Needs meds for vomiting Needs meds for withrawal Need  medical quidance     Follow Up Instructions: I discussed the assessment and treatment plan with the patient. The patient was provided an opportunity to ask questions and all were answered. The patient agreed with the plan and demonstrated an understanding of the instructions.  A copy of instructions were sent to the patient via MyChart.  The patient was advised to call back or seek an in-person evaluation if the symptoms worsen or if the condition fails to improve as anticipated.  Time:  I spent 15 minutes with the patient via telehealth technology discussing the above problems/concerns.    Mary-Margaret Daphine Deutscher, FNP

## 2021-11-18 ENCOUNTER — Telehealth: Payer: BC Managed Care – PPO | Admitting: Physician Assistant

## 2021-11-18 DIAGNOSIS — J029 Acute pharyngitis, unspecified: Secondary | ICD-10-CM | POA: Diagnosis not present

## 2021-11-18 MED ORDER — PREDNISONE 10 MG PO TABS: 30.0000 mg | ORAL_TABLET | Freq: Every day | ORAL | 0 refills | Status: AC

## 2021-11-18 NOTE — Patient Instructions (Signed)
  [name], thank you for joining Piedad Climes, PA-C for today's virtual visit.  While this provider is not your primary care provider (PCP), if your PCP is located in our provider database this encounter information will be shared with them immediately following your visit.  Consent: (Patient) Danny Miles provided verbal consent for this virtual visit at the beginning of the encounter.  Current Medications:  Current Outpatient Medications:    predniSONE (DELTASONE) 10 MG tablet, Take 3 tablets (30 mg total) by mouth daily with breakfast for 5 days., Disp: 15 tablet, Rfl: 0   gabapentin (NEURONTIN) 300 MG capsule, Take 300 mg by mouth 2 (two) times daily as needed., Disp: , Rfl:    Medications ordered in this encounter:  Meds ordered this encounter  Medications   predniSONE (DELTASONE) 10 MG tablet    Sig: Take 3 tablets (30 mg total) by mouth daily with breakfast for 5 days.    Dispense:  15 tablet    Refill:  0    Order Specific Question:   Supervising Provider    Answer:   Hyacinth Meeker, BRIAN [[id number]]     *If you need refills on other medications prior to your next appointment, please contact your pharmacy*  Follow-Up: Call back or seek an in-person evaluation if the symptoms worsen or if the condition fails to improve as anticipated.  Other Instructions Keep well-hydrated and try to rest. Take the prednisone as directed. Salt-water gargles and chloraseptic spray can be beneficial. If you have a humidifier, run it in the bedroom at night.  You can alternate tylenol and ibuprofen as needed. If symptoms are not resolving, or anything worsening despite treatment, please seek an in-person evaluation.   If you have been instructed to have an in-person evaluation today at a local Urgent Care facility, please use the link below. It will take you to a list of all of our available [facility], including address, phone number and hours of operation. Please do  not delay care.  [facility]  If you or a family member do not have a primary care provider, use the link below to schedule a visit and establish care. When you choose a [facility] primary care physician or advanced practice provider, you gain a long-term partner in health. Find a Primary Care Provider  Learn more about [facility]'s in-office and virtual care options: [facility] - Get Care Now

## 2021-11-18 NOTE — Progress Notes (Signed)
Virtual Visit Consent   Danny Miles, you are scheduled for a virtual visit with a [facility] provider today. Just as with appointments in the office, your consent must be obtained to participate. Your consent will be active for this visit and any virtual visit you may have with one of our providers in the next 365 days. If you have a MyChart account, a copy of this consent can be sent to you electronically.  As this is a virtual visit, video technology does not allow for your provider to perform a traditional examination. This may limit your provider's ability to fully assess your condition. If your provider identifies any concerns that need to be evaluated in person or the need to arrange testing (such as labs, EKG, etc.), we will make arrangements to do so. Although advances in technology are sophisticated, we cannot ensure that it will always work on either your end or our end. If the connection with a video visit is poor, the visit may have to be switched to a telephone visit. With either a video or telephone visit, we are not always able to ensure that we have a secure connection.  By engaging in this virtual visit, you consent to the provision of healthcare and authorize for your insurance to be billed (if applicable) for the services provided during this visit. Depending on your insurance coverage, you may receive a charge related to this service.  I need to obtain your verbal consent now. Are you willing to proceed with your visit today? Danny Miles has provided verbal consent on 11/18/2021 for a virtual visit (video or telephone). Danny Miles, Danny Miles  Date: 11/18/2021 Danny Miles  Virtual Visit via Video Note   I, Danny Miles, connected with  Danny Miles  ([id number], [birth year]) on 11/18/21 at  8:15 Miles EDT by a video-enabled telemedicine application and verified that I Miles speaking with the correct person using two identifiers.  Location: Patient: Virtual Visit  Location Patient: Home Provider: Virtual Visit Location Provider: Home Office   I discussed the limitations of evaluation and management by telemedicine and the availability of in person appointments. The patient expressed understanding and agreed to proceed.    History of Present Illness: Danny Miles is a 31 y.o. who identifies as a male who was assigned male at birth, and is being seen today for URI symptoms x 4 days. Notes significant sore throat with a mainly dry cough. Denies fever, chills but did have sweats the other night. Denies recent travel. Does work in [employer] so around a lot of people. Denies sinus pain or tooth pain.   HPI: HPI  Problems:  Patient Active Problem List   Diagnosis Date Noted   Family history of colonic diverticulitis 08/16/2014    Allergies: No Known Allergies Medications:  Current Outpatient Medications:    gabapentin (NEURONTIN) 300 MG capsule, Take 300 mg by mouth 2 (two) times daily as needed., Disp: , Rfl:   Observations/Objective: Patient is well-developed, well-nourished in no acute distress.  Resting comfortably in bed at home.  Head is normocephalic, atraumatic.  No labored breathing. Speech is clear and coherent with logical content.  Patient is alert and oriented at baseline.   Assessment and Plan: 1. Acute viral pharyngitis  Supportive measures and OTC medications reviewed. Start prednisone burst to help with pharyngeal swelling and pain. Strict in-person precautions reviewed. Work note provided.   Follow Up Instructions: I discussed the assessment and treatment plan with the patient.  The patient was provided an opportunity to ask questions and all were answered. The patient agreed with the plan and demonstrated an understanding of the instructions.  A copy of instructions were sent to the patient via MyChart unless otherwise noted below.   The patient was advised to call back or seek an in-person evaluation if the symptoms  worsen or if the condition fails to improve as anticipated.  Time:  I spent 10 minutes with the patient via telehealth technology discussing the above problems/concerns.    Danny Climes, PA-C

## 2022-07-28 DIAGNOSIS — M722 Plantar fascial fibromatosis: Secondary | ICD-10-CM | POA: Diagnosis not present

## 2022-07-28 DIAGNOSIS — Z79899 Other long term (current) drug therapy: Secondary | ICD-10-CM | POA: Diagnosis not present

## 2022-07-28 DIAGNOSIS — Z0001 Encounter for general adult medical examination with abnormal findings: Secondary | ICD-10-CM | POA: Diagnosis not present

## 2022-07-28 DIAGNOSIS — E559 Vitamin D deficiency, unspecified: Secondary | ICD-10-CM | POA: Diagnosis not present

## 2022-08-12 DIAGNOSIS — S0502XA Injury of conjunctiva and corneal abrasion without foreign body, left eye, initial encounter: Secondary | ICD-10-CM | POA: Diagnosis not present

## 2023-05-31 ENCOUNTER — Telehealth: Payer: MEDICAID | Admitting: Physician Assistant

## 2023-05-31 DIAGNOSIS — J208 Acute bronchitis due to other specified organisms: Secondary | ICD-10-CM

## 2023-05-31 MED ORDER — ALBUTEROL SULFATE HFA 108 (90 BASE) MCG/ACT IN AERS: 1.0000 | INHALATION_SPRAY | Freq: Four times a day (QID) | RESPIRATORY_TRACT | 0 refills | Status: AC | PRN

## 2023-05-31 MED ORDER — BENZONATATE 100 MG PO CAPS: 100.0000 mg | ORAL_CAPSULE | Freq: Three times a day (TID) | ORAL | 0 refills | Status: AC | PRN

## 2023-05-31 MED ORDER — PREDNISONE 10 MG (21) PO TBPK: ORAL_TABLET | ORAL | 0 refills | Status: AC

## 2023-05-31 MED ORDER — PROMETHAZINE-DM 6.25-15 MG/5ML PO SYRP: 5.0000 mL | ORAL_SOLUTION | Freq: Four times a day (QID) | ORAL | 0 refills | Status: AC | PRN

## 2023-05-31 NOTE — Patient Instructions (Signed)
Danny Miles, thank you for joining Danny Loveless, PA-C for today's virtual visit.  While this provider is not your primary care provider (PCP), if your PCP is located in our provider database this encounter information will be shared with them immediately following your visit.   A [facility] account gives you access to today's visit and all your visits, tests, and labs performed at [facility] " click here if you don't have a Danny Miles account or go to [email or web address]  Consent: (Patient) Danny Miles provided verbal consent for this virtual visit at the beginning of the encounter.  Current Medications:  Current Outpatient Medications:    albuterol (VENTOLIN HFA) 108 (90 Base) MCG/ACT inhaler, Inhale 1-2 puffs into the lungs every 6 (six) hours as needed., Disp: 8 g, Rfl: 0   benzonatate (TESSALON) 100 MG capsule, Take 1-2 capsules (100-200 mg total) by mouth 3 (three) times daily as needed., Disp: 30 capsule, Rfl: 0   predniSONE (STERAPRED UNI-PAK 21 TAB) 10 MG (21) TBPK tablet, 6 day taper; take as directed on package instructions, Disp: 21 tablet, Rfl: 0   promethazine-dextromethorphan (PROMETHAZINE-DM) 6.25-15 MG/5ML syrup, Take 5 mLs by mouth 4 (four) times daily as needed., Disp: 118 mL, Rfl: 0   gabapentin (NEURONTIN) 300 MG capsule, Take 300 mg by mouth 2 (two) times daily as needed., Disp: , Rfl:    Medications ordered in this encounter:  Meds ordered this encounter  Medications   albuterol (VENTOLIN HFA) 108 (90 Base) MCG/ACT inhaler    Sig: Inhale 1-2 puffs into the lungs every 6 (six) hours as needed.    Dispense:  8 g    Refill:  0    Supervising Provider:   Merrilee Jansky [[id number]]   predniSONE (STERAPRED UNI-PAK 21 TAB) 10 MG (21) TBPK tablet    Sig: 6 day taper; take as directed on package instructions    Dispense:  21 tablet    Refill:  0    Supervising Provider:   Merrilee Jansky [[id number]]   benzonatate  (TESSALON) 100 MG capsule    Sig: Take 1-2 capsules (100-200 mg total) by mouth 3 (three) times daily as needed.    Dispense:  30 capsule    Refill:  0    Supervising Provider:   Merrilee Jansky [[id number]]   promethazine-dextromethorphan (PROMETHAZINE-DM) 6.25-15 MG/5ML syrup    Sig: Take 5 mLs by mouth 4 (four) times daily as needed.    Dispense:  118 mL    Refill:  0    Supervising Provider:   Merrilee Jansky [[id number]]     *If you need refills on other medications prior to your next appointment, please contact your pharmacy*  Follow-Up: Call back or seek an in-person evaluation if the symptoms worsen or if the condition fails to improve as anticipated.   Virtual Care [phone]  Other Instructions Acute Bronchitis, Adult  Acute bronchitis is sudden inflammation of the main airways (bronchi) that come off the windpipe (trachea) in the lungs. The swelling causes the airways to get smaller and make more mucus than normal. This can make it hard to breathe and can cause coughing or noisy breathing (wheezing). Acute bronchitis may last several weeks. The cough may last longer. Allergies, asthma, and exposure to smoke may make the condition worse. What are the causes? This condition can be caused by germs and by substances that irritate the lungs, including: Cold and flu viruses. The most common cause  of this condition is the virus that causes the common cold. Bacteria. This is less common. Breathing in substances that irritate the lungs, including: Smoke from cigarettes and other forms of tobacco. Dust and pollen. Fumes from household cleaning products, gases, or burned fuel. Indoor or outdoor air pollution. What increases the risk? The following factors may make you more likely to develop this condition: A weak body's defense system, also called the immune system. A condition that affects your lungs and breathing, such as asthma. What are the signs or  symptoms? Common symptoms of this condition include: Coughing. This may bring up clear, yellow, or green mucus from your lungs (sputum). Wheezing. Runny or stuffy nose. Having too much mucus in your lungs (chest congestion). Shortness of breath. Aches and pains, including sore throat or chest. How is this diagnosed? This condition is usually diagnosed based on: Your symptoms and medical history. A physical exam. You may also have other tests, including tests to rule out other conditions, such as pneumonia. These tests include: A test of lung function. Test of a mucus sample to look for the presence of bacteria. Tests to check the oxygen level in your blood. Blood tests. Chest X-ray. How is this treated? Most cases of acute bronchitis clear up over time without treatment. Your health care provider may recommend: Drinking more fluids to help thin your mucus so it is easier to cough up. Taking inhaled medicine (inhaler) to improve air flow in and out of your lungs. Using a vaporizer or a humidifier. These are machines that add water to the air to help you breathe better. Taking a medicine that thins mucus and clears congestion (expectorant). Taking a medicine that prevents or stops coughing (cough suppressant). It is not common to take an antibiotic medicine for this condition. Follow these instructions at home:  Take over-the-counter and prescription medicines only as told by your health care provider. Use an inhaler, vaporizer, or humidifier as told by your health care provider. Take two teaspoons (10 mL) of honey at bedtime to lessen coughing at night. Drink enough fluid to keep your urine pale yellow. Do not use any products that contain nicotine or tobacco. These products include cigarettes, chewing tobacco, and vaping devices, such as e-cigarettes. If you need help quitting, ask your health care provider. Get plenty of rest. Return to your normal activities as told by your health  care provider. Ask your health care provider what activities are safe for you. Keep all follow-up visits. This is important. How is this prevented? To lower your risk of getting this condition again: Wash your hands often with soap and water for at least 20 seconds. If soap and water are not available, use hand sanitizer. Avoid contact with people who have cold symptoms. Try not to touch your mouth, nose, or eyes with your hands. Avoid breathing in smoke or chemical fumes. Breathing smoke or chemical fumes will make your condition worse. Get the flu shot every year. Contact a health care provider if: Your symptoms do not improve after 2 weeks. You have trouble coughing up the mucus. Your cough keeps you awake at night. You have a fever. Get help right away if you: Cough up blood. Feel pain in your chest. Have severe shortness of breath. Faint or keep feeling like you are going to faint. Have a severe headache. Have a fever or chills that get worse. These symptoms may represent a serious problem that is an emergency. Do not wait to see if the  symptoms will go away. Get medical help right away. Call your local emergency services (911 in the [place] Do not drive yourself to the hospital. Summary Acute bronchitis is inflammation of the main airways (bronchi) that come off the windpipe (trachea) in the lungs. The swelling causes the airways to get smaller and make more mucus than normal. Drinking more fluids can help thin your mucus so it is easier to cough up. Take over-the-counter and prescription medicines only as told by your health care provider. Do not use any products that contain nicotine or tobacco. These products include cigarettes, chewing tobacco, and vaping devices, such as e-cigarettes. If you need help quitting, ask your health care provider. Contact a health care provider if your symptoms do not improve after 2 weeks. This information is not intended to replace advice given to  you by your health care provider. Make sure you discuss any questions you have with your health care provider. Document Revised: 07/24/2021 Document Reviewed: 08/14/2020 Elsevier Patient Education  2024 Elsevier Inc.   If you have been instructed to have an in-person evaluation today at a local Urgent Care facility, please use the link below. It will take you to a list of all of our available [facility], including address, phone number and hours of operation. Please do not delay care.  [facility]  If you or a family member do not have a primary care provider, use the link below to schedule a visit and establish care. When you choose a [facility] primary care physician or advanced practice provider, you gain a long-term partner in health. Find a Primary Care Provider  Learn more about [facility]'s in-office and virtual care options: [facility] - Get Care Now

## 2023-05-31 NOTE — Progress Notes (Signed)
Virtual Visit Consent   Danny Miles, you are scheduled for a virtual visit with a [facility] provider today. Just as with appointments in the office, your consent must be obtained to participate. Your consent will be active for this visit and any virtual visit you may have with one of our providers in the next 365 days. If you have a MyChart account, a copy of this consent can be sent to you electronically.  As this is a virtual visit, video technology does not allow for your provider to perform a traditional examination. This may limit your provider's ability to fully assess your condition. If your provider identifies any concerns that need to be evaluated in person or the need to arrange testing (such as labs, EKG, etc.), we will make arrangements to do so. Although advances in technology are sophisticated, we cannot ensure that it will always work on either your end or our end. If the connection with a video visit is poor, the visit may have to be switched to a telephone visit. With either a video or telephone visit, we are not always able to ensure that we have a secure connection.  By engaging in this virtual visit, you consent to the provision of healthcare and authorize for your insurance to be billed (if applicable) for the services provided during this visit. Depending on your insurance coverage, you may receive a charge related to this service.  I need to obtain your verbal consent now. Are you willing to proceed with your visit today? Danny Miles has provided verbal consent on 05/31/2023 for a virtual visit (video or telephone). Margaretann Loveless, PA-C  Date: 05/31/2023 9:38 AM  Virtual Visit via Video Note   I, Margaretann Loveless, connected with  Danny Miles  ([id number], 1990-06-22) on 05/31/23 at  9:30 AM EST by a video-enabled telemedicine application and verified that I am speaking with the correct person using two identifiers.  Location: Patient: Virtual Visit  Location Patient: Home Provider: Virtual Visit Location Provider: Home Office   I discussed the limitations of evaluation and management by telemedicine and the availability of in person appointments. The patient expressed understanding and agreed to proceed.    History of Present Illness: Danny Miles is a 33 y.o. who identifies as a male who was assigned male at birth, and is being seen today for cough.  HPI: URI  This is a new problem. The current episode started 1 to 4 weeks ago (1 week). The problem has been gradually improving. Associated symptoms include chest pain (tightness and pressure), congestion, coughing, diarrhea, headaches, rhinorrhea (improving), sinus pain (improving), a sore throat (improved) and wheezing (intermittent). Pertinent negatives include no ear pain, nausea, plugged ear sensation or vomiting. Associated symptoms comments: Night sweats. Treatments tried: mucinex. The treatment provided no relief.     Problems:  Patient Active Problem List   Diagnosis Date Noted   Family history of colonic diverticulitis 08/16/2014    Allergies: No Known Allergies Medications:  Current Outpatient Medications:    albuterol (VENTOLIN HFA) 108 (90 Base) MCG/ACT inhaler, Inhale 1-2 puffs into the lungs every 6 (six) hours as needed., Disp: 8 g, Rfl: 0   benzonatate (TESSALON) 100 MG capsule, Take 1-2 capsules (100-200 mg total) by mouth 3 (three) times daily as needed., Disp: 30 capsule, Rfl: 0   predniSONE (STERAPRED UNI-PAK 21 TAB) 10 MG (21) TBPK tablet, 6 day taper; take as directed on package instructions, Disp: 21 tablet, Rfl: 0  promethazine-dextromethorphan (PROMETHAZINE-DM) 6.25-15 MG/5ML syrup, Take 5 mLs by mouth 4 (four) times daily as needed., Disp: 118 mL, Rfl: 0   gabapentin (NEURONTIN) 300 MG capsule, Take 300 mg by mouth 2 (two) times daily as needed., Disp: , Rfl:   Observations/Objective: Patient is well-developed, well-nourished in no acute distress.   Resting comfortably at home.  Head is normocephalic, atraumatic.  No labored breathing.  Speech is clear and coherent with logical content.  Patient is alert and oriented at baseline.    Assessment and Plan: 1. Viral bronchitis (Primary) - albuterol (VENTOLIN HFA) 108 (90 Base) MCG/ACT inhaler; Inhale 1-2 puffs into the lungs every 6 (six) hours as needed.  Dispense: 8 g; Refill: 0 - predniSONE (STERAPRED UNI-PAK 21 TAB) 10 MG (21) TBPK tablet; 6 day taper; take as directed on package instructions  Dispense: 21 tablet; Refill: 0 - benzonatate (TESSALON) 100 MG capsule; Take 1-2 capsules (100-200 mg total) by mouth 3 (three) times daily as needed.  Dispense: 30 capsule; Refill: 0 - promethazine-dextromethorphan (PROMETHAZINE-DM) 6.25-15 MG/5ML syrup; Take 5 mLs by mouth 4 (four) times daily as needed.  Dispense: 118 mL; Refill: 0  - Worsening over a week despite OTC medications - Will treat with Albuterol, Prednisone, Promethazine DM and tessalon perles - Can continue Mucinex  - Push fluids.  - Rest.  - Steam and humidifier can help - Seek in person evaluation if worsening or symptoms fail to improve    Follow Up Instructions: I discussed the assessment and treatment plan with the patient. The patient was provided an opportunity to ask questions and all were answered. The patient agreed with the plan and demonstrated an understanding of the instructions.  A copy of instructions were sent to the patient via MyChart unless otherwise noted below.    The patient was advised to call back or seek an in-person evaluation if the symptoms worsen or if the condition fails to improve as anticipated.    Margaretann Loveless, PA-C

## 2023-11-04 ENCOUNTER — Ambulatory Visit
Admission: EM | Admit: 2023-11-04 | Discharge: 2023-11-04 | Disposition: A | Discharge status: Home / Self Care | Payer: MEDICAID | Attending: Emergency Medicine | Admitting: Emergency Medicine

## 2023-11-04 DIAGNOSIS — Z113 Encounter for screening for infections with a predominantly sexual mode of transmission: Secondary | ICD-10-CM | POA: Insufficient documentation

## 2023-11-04 LAB — HIV ANTIBODY (ROUTINE TESTING W REFLEX): HIV Screen 4th Generation wRfx: NONREACTIVE

## 2023-11-04 NOTE — ED Triage Notes (Signed)
 Pt would like to be checked for STDs and would like blood work as well.

## 2023-11-04 NOTE — ED Provider Notes (Signed)
 [facility]    CSN: [id number] Arrival date & time: 11/04/23  1502      History   Chief Complaint Chief Complaint  Patient presents with   Exposure to STD    HPI Samar Dass is a 33 y.o. male.   33 year old male, Paxon Propes, presents to urgent care for STI testing.  Patient denies any symptoms just wants to get checked out  The history is provided by the patient. No language interpreter was used.    Past Medical History:  Diagnosis Date   ADHD     Patient Active Problem List   Diagnosis Date Noted   Routine screening for STI (sexually transmitted infection) 11/04/2023   Family history of colonic diverticulitis 08/16/2014    Past Surgical History:  Procedure Laterality Date   FEMUR SURGERY         Home Medications    Prior to Admission medications   Medication Sig Start Date End Date Taking? Authorizing Provider  albuterol  (VENTOLIN  HFA) 108 (90 Base) MCG/ACT inhaler Inhale 1-2 puffs into the lungs every 6 (six) hours as needed. 05/31/23   Vivienne Delon HERO, PA-C  benzonatate  (TESSALON ) 100 MG capsule Take 1-2 capsules (100-200 mg total) by mouth 3 (three) times daily as needed. 05/31/23   Burnette, Jennifer M, PA-C  gabapentin (NEURONTIN) 300 MG capsule Take 300 mg by mouth 2 (two) times daily as needed. Patient not taking: Reported on 11/04/2023 07/08/21   [provider]  predniSONE  (STERAPRED UNI-PAK 21 TAB) 10 MG (21) TBPK tablet 6 day taper; take as directed on package instructions 05/31/23   Vivienne Delon HERO, PA-C  promethazine -dextromethorphan (PROMETHAZINE -DM) 6.25-15 MG/5ML syrup Take 5 mLs by mouth 4 (four) times daily as needed. 05/31/23   Vivienne Delon HERO, PA-C    Family History Family History  Problem Relation Age of Onset   Healthy Mother    Healthy Father     Social History Social History   Tobacco Use   Smoking status: Former    Types: Cigarettes   Smokeless tobacco: Never  Vaping Use   Vaping  status: Never Used  Substance Use Topics   Alcohol use: Yes    Alcohol/week: 1.0 standard drink of alcohol    Types: 1 Shots of liquor per week   Drug use: No     Allergies   Patient has no known allergies.   Review of Systems Review of Systems  Genitourinary:  Negative for penile discharge.  All other systems reviewed and are negative.    Physical Exam Triage Vital Signs ED Triage Vitals  Encounter Vitals Group     BP 11/04/23 1528 125/85     Girls Systolic BP Percentile --      Girls Diastolic BP Percentile --      Boys Systolic BP Percentile --      Boys Diastolic BP Percentile --      Pulse Rate 11/04/23 1528 93     Resp 11/04/23 1528 18     Temp 11/04/23 1528 98.5 F (36.9 C)     Temp Source 11/04/23 1528 Oral     SpO2 11/04/23 1528 100 %     Weight --      Height --      Head Circumference --      Peak Flow --      Pain Score 11/04/23 1530 0     Pain Loc --      Pain Education --  Exclude from Growth Chart --    No data found.  Updated Vital Signs BP 125/85 (BP Location: Left Arm)   Pulse 93   Temp 98.5 F (36.9 C) (Oral)   Resp 18   SpO2 100%   Visual Acuity Right Eye Distance:   Left Eye Distance:   Bilateral Distance:    Right Eye Near:   Left Eye Near:    Bilateral Near:     Physical Exam Vitals and nursing note reviewed.  Genitourinary:    Comments: Self swabbed Neurological:     General: No focal deficit present.     Mental Status: He is alert and oriented to person, place, and time.     GCS: GCS eye subscore is 4. GCS verbal subscore is 5. GCS motor subscore is 6.      UC Treatments / Results  Labs (all labs ordered are listed, but only abnormal results are displayed) Labs Reviewed  HIV ANTIBODY (ROUTINE TESTING W REFLEX)  RPR  CYTOLOGY, (ORAL, ANAL, URETHRAL) ANCILLARY ONLY    EKG   Radiology No results found.  Procedures Procedures (including critical care time)  Medications Ordered in UC Medications - No  data to display  Initial Impression / Assessment and Plan / UC Course  I have reviewed the triage vital signs and the nursing notes.  Pertinent labs & imaging results that were available during my care of the patient were reviewed by me and considered in my medical decision making (see chart for details).    Discussed exam findings and plan of care with patient, strict go to ER precautions given.   Patient verbalized understanding to this provider.  Ddx: Routine STI testing Final Clinical Impressions(s) / UC Diagnoses   Final diagnoses:  Routine screening for STI (sexually transmitted infection)     Discharge Instructions      Check my chart for results. Avoid sexual activity until results,treatment known and completed. Safe sex,condoms, with all future sexual activity. We have sent testing for sexually transmitted infections. We will notify you of any positive results once they are received. If you test positive for HIV you will be referred to infectious disease for treatment/management. If required, we will prescribe any medications you might need. We do not notify of negative results.    ED Prescriptions   None    PDMP not reviewed this encounter.   Aminta Loose, NP 11/04/23 2110

## 2023-11-04 NOTE — Discharge Instructions (Signed)
 Check my chart for results. Avoid sexual activity until results,treatment known and completed. Safe sex,condoms, with all future sexual activity. We have sent testing for sexually transmitted infections. We will notify you of any positive results once they are received. If you test positive for HIV you will be referred to infectious disease for treatment/management. If required, we will prescribe any medications you might need. We do not notify of negative results.

## 2023-11-05 LAB — CYTOLOGY, (ORAL, ANAL, URETHRAL) ANCILLARY ONLY
Chlamydia: NEGATIVE
Comment: NEGATIVE
Comment: NEGATIVE
Comment: NORMAL
Neisseria Gonorrhea: NEGATIVE
Trichomonas: NEGATIVE

## 2023-11-05 LAB — RPR: RPR Ser Ql: NONREACTIVE

## 2023-11-08 ENCOUNTER — Ambulatory Visit: Payer: MEDICAID
